# Patient Record
Sex: Male | Born: 1970 | Race: Black or African American | Hispanic: No | Marital: Single
Health system: Southern US, Community
[De-identification: ages and names within clinical notes are randomized; demographics above are authoritative.]

## PROBLEM LIST (undated history)

## (undated) DIAGNOSIS — E785 Hyperlipidemia, unspecified: Secondary | ICD-10-CM

## (undated) DIAGNOSIS — I10 Essential (primary) hypertension: Secondary | ICD-10-CM

---

## 2019-05-03 ENCOUNTER — Encounter (HOSPITAL_COMMUNITY): Payer: Self-pay | Admitting: Emergency Medicine

## 2019-05-03 ENCOUNTER — Emergency Department (HOSPITAL_COMMUNITY): Payer: Self-pay

## 2019-05-03 ENCOUNTER — Emergency Department (HOSPITAL_COMMUNITY)
Admission: EM | Admit: 2019-05-03 | Discharge: 2019-05-03 | Disposition: A | Discharge status: Home / Self Care | Payer: Self-pay | Attending: Emergency Medicine | Admitting: Emergency Medicine

## 2019-05-03 ENCOUNTER — Other Ambulatory Visit: Payer: Self-pay

## 2019-05-03 DIAGNOSIS — I10 Essential (primary) hypertension: Secondary | ICD-10-CM | POA: Insufficient documentation

## 2019-05-03 DIAGNOSIS — E876 Hypokalemia: Secondary | ICD-10-CM | POA: Insufficient documentation

## 2019-05-03 DIAGNOSIS — R911 Solitary pulmonary nodule: Secondary | ICD-10-CM | POA: Insufficient documentation

## 2019-05-03 DIAGNOSIS — R0789 Other chest pain: Secondary | ICD-10-CM | POA: Insufficient documentation

## 2019-05-03 DIAGNOSIS — Z79899 Other long term (current) drug therapy: Secondary | ICD-10-CM | POA: Insufficient documentation

## 2019-05-03 DIAGNOSIS — M79606 Pain in leg, unspecified: Secondary | ICD-10-CM | POA: Insufficient documentation

## 2019-05-03 DIAGNOSIS — R410 Disorientation, unspecified: Secondary | ICD-10-CM | POA: Insufficient documentation

## 2019-05-03 DIAGNOSIS — R519 Headache, unspecified: Secondary | ICD-10-CM | POA: Insufficient documentation

## 2019-05-03 DIAGNOSIS — R109 Unspecified abdominal pain: Secondary | ICD-10-CM

## 2019-05-03 HISTORY — DX: Essential (primary) hypertension: I10

## 2019-05-03 HISTORY — DX: Hyperlipidemia, unspecified: E78.5

## 2019-05-03 LAB — MAGNESIUM: Magnesium: 2.2 mg/dL (ref 1.7–2.4)

## 2019-05-03 LAB — COMPREHENSIVE METABOLIC PANEL
ALT: 23 U/L (ref 0–44)
AST: 36 U/L (ref 15–41)
Albumin: 3.7 g/dL (ref 3.5–5.0)
Alkaline Phosphatase: 38 U/L (ref 38–126)
Anion gap: 11 (ref 5–15)
BUN: 10 mg/dL (ref 6–20)
CO2: 25 mmol/L (ref 22–32)
Calcium: 9 mg/dL (ref 8.9–10.3)
Chloride: 101 mmol/L (ref 98–111)
Creatinine, Ser: 1.11 mg/dL (ref 0.61–1.24)
GFR calc Af Amer: >60 mL/min (ref >60)
GFR calc non Af Amer: >60 mL/min (ref >60)
Glucose, Bld: 109 mg/dL — ABNORMAL HIGH (ref 70–99)
Potassium: 2.7 mmol/L — CL (ref 3.5–5.1)
Sodium: 137 mmol/L (ref 135–145)
Total Bilirubin: 1.5 mg/dL — ABNORMAL HIGH (ref 0.3–1.2)
Total Protein: 7.2 g/dL (ref 6.5–8.1)

## 2019-05-03 LAB — I-STAT CHEM 8, ED
BUN: 11 mg/dL (ref 6–20)
Calcium, Ion: 1.07 mmol/L — ABNORMAL LOW (ref 1.15–1.40)
Chloride: 100 mmol/L (ref 98–111)
Creatinine, Ser: 1 mg/dL (ref 0.61–1.24)
Glucose, Bld: 106 mg/dL — ABNORMAL HIGH (ref 70–99)
HCT: 43 % (ref 39.0–52.0)
Hemoglobin: 14.6 g/dL (ref 13.0–17.0)
Potassium: 2.8 mmol/L — ABNORMAL LOW (ref 3.5–5.1)
Sodium: 139 mmol/L (ref 135–145)
TCO2: 30 mmol/L (ref 22–32)

## 2019-05-03 LAB — CBC WITH DIFFERENTIAL/PLATELET
Abs Immature Granulocytes: 0.02 10*3/uL (ref 0.00–0.07)
Basophils Absolute: 0 10*3/uL (ref 0.0–0.1)
Basophils Relative: 0 %
Eosinophils Absolute: 0.1 10*3/uL (ref 0.0–0.5)
Eosinophils Relative: 1 %
HCT: 40.4 % (ref 39.0–52.0)
Hemoglobin: 14 g/dL (ref 13.0–17.0)
Immature Granulocytes: 0 %
Lymphocytes Relative: 18 %
Lymphs Abs: 1.4 10*3/uL (ref 0.7–4.0)
MCH: 27.9 pg (ref 26.0–34.0)
MCHC: 34.7 g/dL (ref 30.0–36.0)
MCV: 80.5 fL (ref 80.0–100.0)
Monocytes Absolute: 0.8 10*3/uL (ref 0.1–1.0)
Monocytes Relative: 9 %
Neutro Abs: 5.8 10*3/uL (ref 1.7–7.7)
Neutrophils Relative %: 72 %
Platelets: 251 10*3/uL (ref 150–400)
RBC: 5.02 MIL/uL (ref 4.22–5.81)
RDW: 13.6 % (ref 11.5–15.5)
WBC: 8 10*3/uL (ref 4.0–10.5)
nRBC: 0 % (ref 0.0–0.2)

## 2019-05-03 LAB — TROPONIN I (HIGH SENSITIVITY)
Troponin I (High Sensitivity): 4 ng/L (ref <18)
Troponin I (High Sensitivity): 2 ng/L (ref <18)

## 2019-05-03 LAB — URINALYSIS, ROUTINE W REFLEX MICROSCOPIC
Bilirubin Urine: NEGATIVE
Glucose, UA: NEGATIVE mg/dL
Hgb urine dipstick: NEGATIVE
Ketones, ur: NEGATIVE mg/dL
Leukocytes,Ua: NEGATIVE
Nitrite: NEGATIVE
Protein, ur: NEGATIVE mg/dL
Specific Gravity, Urine: 1.016 (ref 1.005–1.030)
pH: 7 (ref 5.0–8.0)

## 2019-05-03 LAB — RAPID URINE DRUG SCREEN, HOSP PERFORMED
Amphetamines: NOT DETECTED
Barbiturates: NOT DETECTED
Benzodiazepines: NOT DETECTED
Cocaine: NOT DETECTED
Opiates: NOT DETECTED
Tetrahydrocannabinol: NOT DETECTED

## 2019-05-03 LAB — ETHANOL: Alcohol, Ethyl (B): <10 mg/dL (ref <10)

## 2019-05-03 MED ORDER — POTASSIUM CHLORIDE CRYS ER 20 MEQ PO TBCR
40.0000 meq | EXTENDED_RELEASE_TABLET | Freq: Once | ORAL | Status: AC
  Administered 2019-05-03: 40 meq via ORAL
  Filled 2019-05-03: qty 2

## 2019-05-03 MED ORDER — IOHEXOL 350 MG/ML SOLN
100.0000 mL | Freq: Once | INTRAVENOUS | Status: AC | PRN
  Administered 2019-05-03: 100 mL via INTRAVENOUS

## 2019-05-03 MED ORDER — LACTATED RINGERS IV BOLUS
1000.0000 mL | Freq: Once | INTRAVENOUS | Status: AC
  Administered 2019-05-03: 1000 mL via INTRAVENOUS

## 2019-05-03 MED ORDER — POTASSIUM CHLORIDE ER 20 MEQ PO TBCR: 20.0000 meq | EXTENDED_RELEASE_TABLET | Freq: Every day | ORAL | 0 refills | Status: DC

## 2019-05-03 MED ORDER — ACETAMINOPHEN 325 MG PO TABS
650.0000 mg | ORAL_TABLET | Freq: Once | ORAL | Status: AC
  Administered 2019-05-03: 650 mg via ORAL
  Filled 2019-05-03: qty 2

## 2019-05-03 MED ORDER — POTASSIUM CHLORIDE ER 20 MEQ PO TBCR: 20.0000 meq | EXTENDED_RELEASE_TABLET | Freq: Every day | ORAL | 0 refills | Status: AC

## 2019-05-03 NOTE — ED Notes (Signed)
Lab notified of add on magnesium.

## 2019-05-03 NOTE — ED Triage Notes (Signed)
Per EMS: pt traveling and was picked up at a hotel with c/o right sided groin pain, left sided flank pain, headache,  and hypertension. Pt was negative for stroke screening per EMS.  Pt presenting with "odd Behavior" and slow to respond. Pt self administered 2 - 81 mg ASA.    EMS vitals: BP 182/110 CBG 112

## 2019-05-03 NOTE — Discharge Instructions (Signed)
You had labs and a CT scan performed in the Emergency Department today. Your potassium was low, which may contribute to your cramping. You may take Tylenol or ibuprofen, available over-the-counter according to label instructions as needed for pain. Please follow-up with your family doctor in the next week for recheck of your labs. Your CT scan showed a pulmonary nodule on your right lung. This will need to follow-up in 12 months for repeat imaging.

## 2019-05-03 NOTE — ED Notes (Signed)
Did ekg shown to Dr Madilyn Hook

## 2019-05-03 NOTE — ED Provider Notes (Signed)
[facility] EMERGENCY DEPARTMENT Provider Note   CSN: [id number] Arrival date & time: 05/03/19  4008     History Chief Complaint  Patient presents with  . Groin Pain  . Leg Pain  . Flank Pain    Travis Jarvis is a 49 y.o. male.  The history is provided by the patient and the EMS personnel. No language interpreter was used.  Groin Pain  Leg Pain Flank Pain   Travis Jarvis is a 49 y.o. male who presents to the Emergency Department complaining of flank pain, headache. Level V caveat due to confusion. History is provided by the patient and EMS. Per EMS patient is traveling and was picked up in a hotel with complaints of right sided groin pain, flank pain, headache, chest pain. He states that he was feeling well until 1030 last night when he developed chest pain followed by right flank, groin and leg pain. He has associated headache. He states that he feels somewhat confused. He denies any nausea, vomiting. He states that he did have difficulty swallowing when this first began. He has a history of hypertension. He denies any tobacco use. He drinks occasional alcohol, none recently. Denies any drug use. Symptoms are severe in nature. Overall his pain is improving.    Past Medical History:  Diagnosis Date  . Hyperlipemia   . Hypertension     There are no problems to display for this patient.   * The histories are not reviewed yet. Please review them in the "History" navigator section and refresh this [place].     No family history on file.  Social History   Tobacco Use  . Smoking status: Not on file  Substance Use Topics  . Alcohol use: Yes  . Drug use: Not Currently    Home Medications Prior to Admission medications   Medication Sig Start Date End Date Taking? Authorizing Provider  amLODipine (NORVASC) 10 MG tablet Take 10 mg by mouth daily.   Yes [provider]  aspirin EC 81 MG tablet Take 81 mg by mouth as needed for mild  pain.   Yes [provider]  Potassium Chloride ER 20 MEQ TBCR Take 20 mEq by mouth daily. 05/03/19   Quintella Reichert, MD    Allergies    Penicillins  Review of Systems   Review of Systems  Genitourinary: Positive for flank pain.  All other systems reviewed and are negative.   Physical Exam Updated Vital Signs BP 139/90   Pulse 98   Temp 98.9 F (37.2 C)   Resp (!) 22   SpO2 95%   Physical Exam Vitals and nursing note reviewed.  Constitutional:      Appearance: He is well-developed.  HENT:     Head: Normocephalic and atraumatic.  Cardiovascular:     Rate and Rhythm: Normal rate and regular rhythm.     Heart sounds: No murmur.  Pulmonary:     Effort: Pulmonary effort is normal. No respiratory distress.     Breath sounds: Normal breath sounds.  Abdominal:     Palpations: Abdomen is soft.     Tenderness: There is no guarding or rebound.     Comments: Mild generalized abdominal tenderness  Genitourinary:    Penis: Normal.      Testes: Normal.  Musculoskeletal:        General: No tenderness.     Comments: 2+ DP pulses bilaterally, 2+ radial pulses bilaterally.  Skin:    General: Skin is warm and  dry.  Neurological:     Mental Status: He is alert.     Comments: Confused and slow to answer questions. Frequently rolls eyes back to the right but is able to redirect. EOM I. 4/5 strength in all four extremities with sensation to light touch intact in all four extremities. Oriented to place and time. Disoriented to recent events.  Psychiatric:     Comments: Poor eye contact     ED Results / Procedures / Treatments   Labs (all labs ordered are listed, but only abnormal results are displayed) Labs Reviewed  COMPREHENSIVE METABOLIC PANEL - Abnormal; Notable for the following components:      Result Value   Potassium 2.7 (*)    Glucose, Bld 109 (*)    Total Bilirubin 1.5 (*)    All other components within normal limits  URINALYSIS, ROUTINE W REFLEX MICROSCOPIC -  Abnormal; Notable for the following components:   Color, Urine STRAW (*)    All other components within normal limits  I-STAT CHEM 8, ED - Abnormal; Notable for the following components:   Potassium 2.8 (*)    Glucose, Bld 106 (*)    Calcium, Ion 1.07 (*)    All other components within normal limits  ETHANOL  CBC WITH DIFFERENTIAL/PLATELET  RAPID URINE DRUG SCREEN, HOSP PERFORMED  MAGNESIUM  TROPONIN I (HIGH SENSITIVITY)  TROPONIN I (HIGH SENSITIVITY)    EKG EKG Interpretation  Date/Time:  Saturday May 03 2019 07:47:58 EST Ventricular Rate:  96 PR Interval:    QRS Duration: 94 QT Interval:  340 QTC Calculation: 430 R Axis:   67 Text Interpretation: Sinus rhythm baseline wander in V2, V3 Nonspecific ST abnormality Confirmed by Tilden Fossa [phone]) on 05/03/2019 8:03:53 AM   Radiology CT Head Wo Contrast  Result Date: 05/03/2019 CLINICAL DATA:  49 year old male with altered mental status. EXAM: CT HEAD WITHOUT CONTRAST TECHNIQUE: Contiguous axial images were obtained from the base of the skull through the vertex without intravenous contrast. COMPARISON:  None. FINDINGS: Brain: No evidence of acute infarction, hemorrhage, hydrocephalus, extra-axial collection or mass lesion/mass effect. Vascular: No hyperdense vessel or unexpected calcification. Skull: Normal. Negative for fracture or focal lesion. Sinuses/Orbits: No acute finding. Other: None. IMPRESSION: Unremarkable noncontrast head CT. Electronically Signed   By: Harmon Pier M.D.   On: 05/03/2019 08:25   DG Chest Port 1 View  Result Date: 05/03/2019 CLINICAL DATA:  Acute chest pain. EXAM: PORTABLE CHEST 1 VIEW COMPARISON:  None. FINDINGS: UPPER limits normal heart size noted. There is no evidence of focal airspace disease, pulmonary edema, suspicious pulmonary nodule/mass, pleural effusion, or pneumothorax. No acute bony abnormalities are identified. IMPRESSION: No active disease. Electronically Signed   By: Harmon Pier M.D.    On: 05/03/2019 08:19   CT Angio Chest/Abd/Pel for Dissection W and/or W/WO  Result Date: 05/03/2019 CLINICAL DATA:  Hypertension, left flank pain. EXAM: CT ANGIOGRAPHY CHEST, ABDOMEN AND PELVIS TECHNIQUE: Multidetector CT imaging through the chest, abdomen and pelvis was performed using the standard protocol during bolus administration of intravenous contrast. Multiplanar reconstructed images and MIPs were obtained and reviewed to evaluate the vascular anatomy. CONTRAST:  OMNIPAQUE IOHEXOL 350 MG/ML SOLN COMPARISON:  None. FINDINGS: CTA CHEST FINDINGS Cardiovascular: Preferential opacification of the thoracic aorta. No evidence of thoracic aortic aneurysm or dissection. Normal heart size. No pericardial effusion. Mediastinum/Nodes: No enlarged mediastinal, hilar, or axillary lymph nodes. Thyroid gland, trachea, and esophagus demonstrate no significant findings. Lungs/Pleura: No pneumothorax or pleural effusion is noted. 3  mm right apical nodule is noted best seen on image number 43 of series 6. No consolidative process is noted. Musculoskeletal: No chest wall abnormality. No acute or significant osseous findings. Review of the MIP images confirms the above findings. CTA ABDOMEN AND PELVIS FINDINGS VASCULAR Aorta: Normal caliber aorta without aneurysm, dissection, vasculitis or significant stenosis. Celiac: Patent without evidence of aneurysm, dissection, vasculitis or significant stenosis. SMA: Patent without evidence of aneurysm, dissection, vasculitis or significant stenosis. Renals: Both renal arteries are patent without evidence of aneurysm, dissection, vasculitis, fibromuscular dysplasia or significant stenosis. IMA: Patent without evidence of aneurysm, dissection, vasculitis or significant stenosis. Inflow: Patent without evidence of aneurysm, dissection, vasculitis or significant stenosis. Veins: No obvious venous abnormality within the limitations of this arterial phase study. Review of the MIP  images confirms the above findings. NON-VASCULAR Hepatobiliary: No focal liver abnormality is seen. No gallstones, gallbladder wall thickening, or biliary dilatation. Pancreas: Unremarkable. No pancreatic ductal dilatation or surrounding inflammatory changes. Spleen: Normal in size without focal abnormality. Adrenals/Urinary Tract: Adrenal glands are unremarkable. Kidneys are normal, without renal calculi, focal lesion, or hydronephrosis. Bladder is unremarkable. Stomach/Bowel: Stomach is within normal limits. Appendix appears normal. No evidence of bowel wall thickening, distention, or inflammatory changes. Lymphatic: No significant adenopathy is noted. Reproductive: Prostate is unremarkable. Other: No abdominal wall hernia or abnormality. No abdominopelvic ascites. Musculoskeletal: No acute or significant osseous findings. Review of the MIP images confirms the above findings. IMPRESSION: 1. No evidence of thoracic or abdominal aortic dissection or aneurysm. 2. 3 mm right apical nodule is noted. No follow-up needed if patient is low-risk. Non-contrast chest CT can be considered in 12 months if patient is high-risk. This recommendation follows the consensus statement: Guidelines for Management of Incidental Pulmonary Nodules Detected on CT Images: From the Fleischner Society 2017; Radiology 2017; 284:228-243. 3. No other abnormality seen in the chest, abdomen or pelvis. Electronically Signed   By: Lupita Raider M.D.   On: 05/03/2019 08:56    Procedures Procedures (including critical care time)  Medications Ordered in ED Medications  iohexol (OMNIPAQUE) 350 MG/ML injection 100 mL (100 mLs Intravenous Contrast Given 05/03/19 0804)  potassium chloride SA (KLOR-CON) CR tablet 40 mEq (40 mEq Oral Given 05/03/19 0854)  lactated ringers bolus 1,000 mL (0 mLs Intravenous Stopped 05/03/19 1035)  acetaminophen (TYLENOL) tablet 650 mg (650 mg Oral Given 05/03/19 1230)    ED Course  I have reviewed the triage vital  signs and the nursing notes.  Pertinent labs & imaging results that were available during my care of the patient were reviewed by me and considered in my medical decision making (see chart for details).    MDM Rules/Calculators/A&P                     Patient here for evaluation of chest pain, flank pain, leg pain and headache. Patient mildly confused and drowsy on initial evaluation. Imaging obtained to rule out dissection. CTA is negative for dissection, CT head is negative for acute disease process. CT does demonstrate incidental finding of pulmonary nodule, discussed this with the patient. Labs are significant for hypokalemia, no additional significant abnormalities. On repeat assessment patient appears improved. He was treated with potassium replacement. Patient does state that he is been very tired lately and is currently traveling to attend a funeral in [place]. Presentation is not consistent with CVA, meningitis, serious bacterial infection. Discussed with patient home care for hypokalemia and importance of outpatient follow-up. Results of  studies also discussed with the patient sister at his request over the phone. Final Clinical Impression(s) / ED Diagnoses Final diagnoses:  Hypokalemia  Pulmonary nodule  Flank pain    Rx / DC Orders ED Discharge Orders         Ordered    potassium chloride 20 MEQ TBCR  Daily,   Status:  Discontinued     05/03/19 1216    Potassium Chloride ER 20 MEQ TBCR  Daily     05/03/19 1227           Tilden Fossa, MD 05/03/19 1250

## 2019-05-03 NOTE — ED Notes (Signed)
Pt transported to CT ?

## 2019-05-03 NOTE — ED Notes (Signed)
Pt drank full cup of water without issue.  

## 2021-01-12 IMAGING — CT CT ANGIO CHEST-ABD-PELV FOR DISSECTION W/ AND WO/W CM
2 of 7 series · 11 of 36 positions shown, 15 images · IV contrast (APPLIED)
Comparison: None.

CLINICAL DATA: Hypertension, left flank pain.

EXAM:
CT ANGIOGRAPHY CHEST, ABDOMEN AND PELVIS
TECHNIQUE: Multidetector CT imaging through the chest, abdomen and pelvis was
performed using the standard protocol during bolus administration of
intravenous contrast. Multiplanar reconstructed images and MIPs were
obtained and reviewed to evaluate the vascular anatomy.
CONTRAST:  100mL OMNIPAQUE IOHEXOL 350 MG/ML SOLN

[Series 5: arterial · axial · arterial · 0.73mm/px · z∈[-943,-303]mm · 10 of 366 slices shown, 13 images]
[im 23/366  mediastinal]
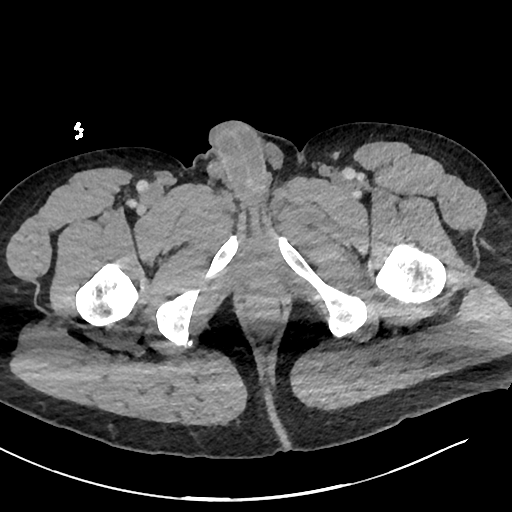
[im 23/366  bone]
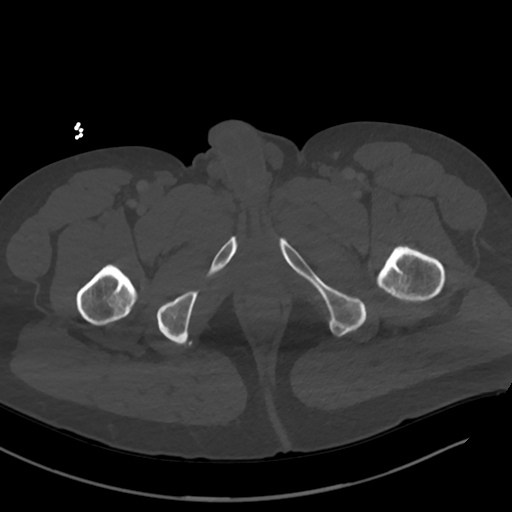
[im 69/366  mediastinal]
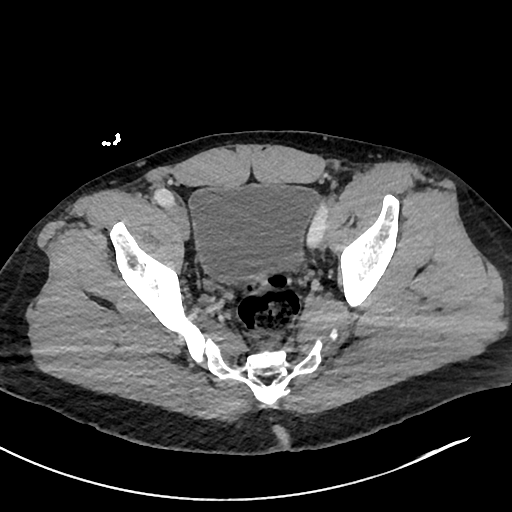
[im 115/366  mediastinal]
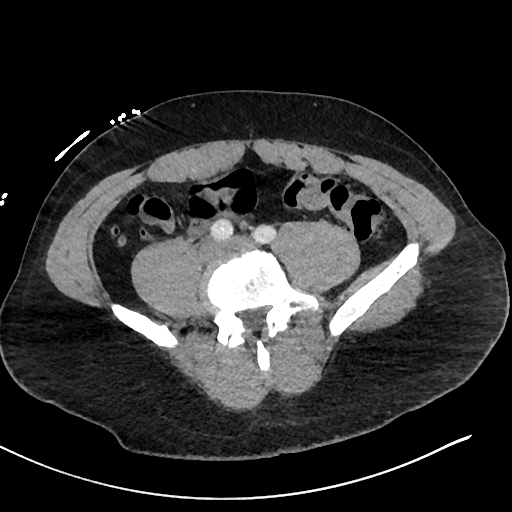
[im 160/366  mediastinal]
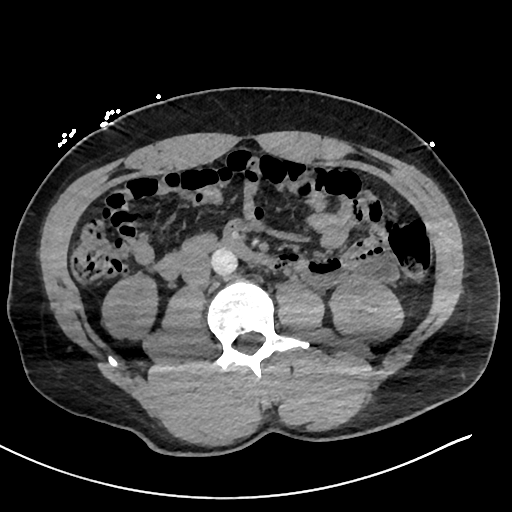
[im 206/366  mediastinal]
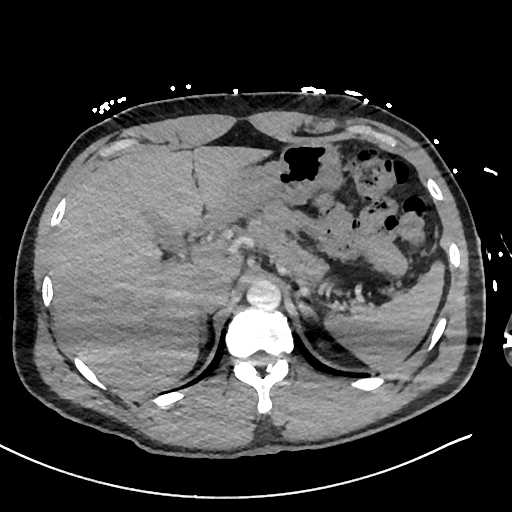
[im 251/366  mediastinal]
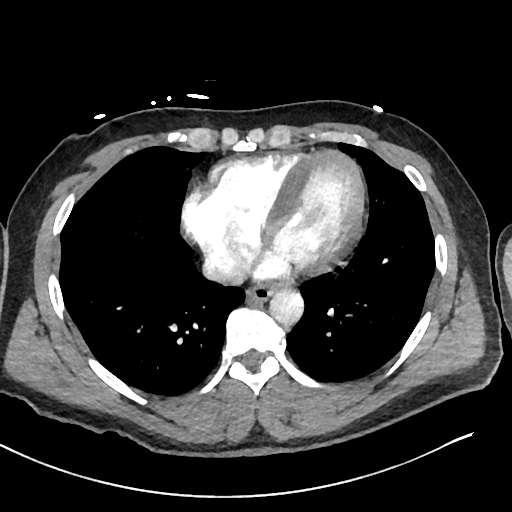
[im 274/366  lung]
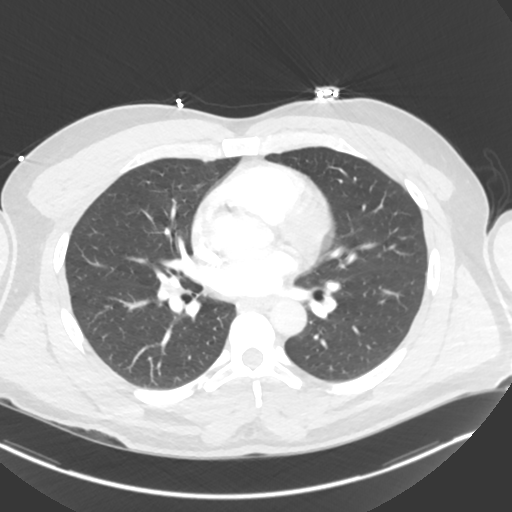
[im 297/366  mediastinal]
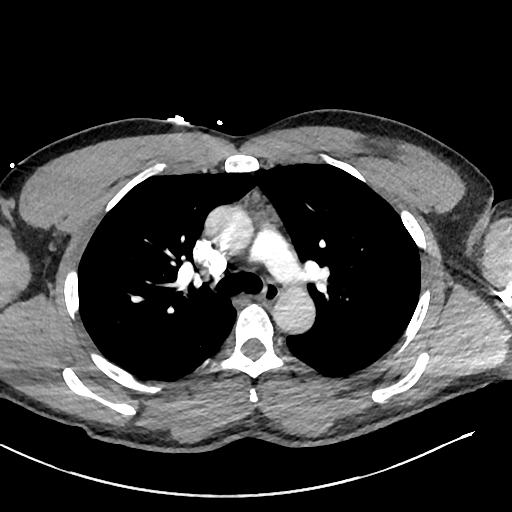
[im 297/366  lung]
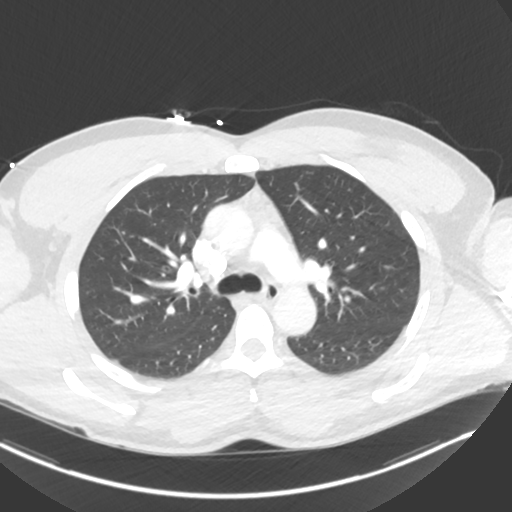
[im 320/366  lung]
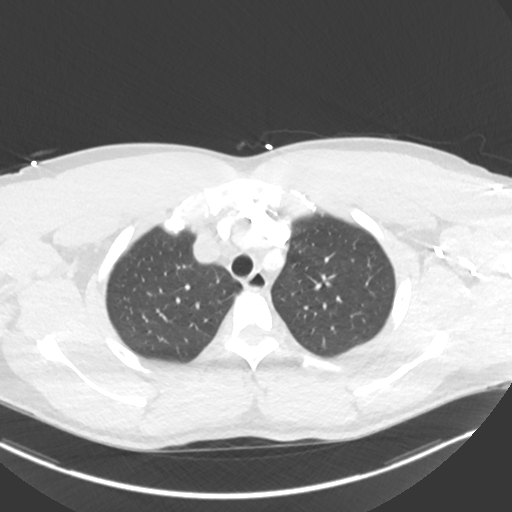
[im 343/366  mediastinal]
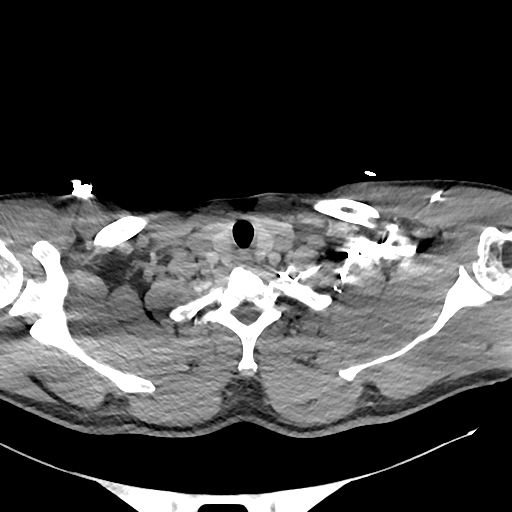
[im 343/366  lung]
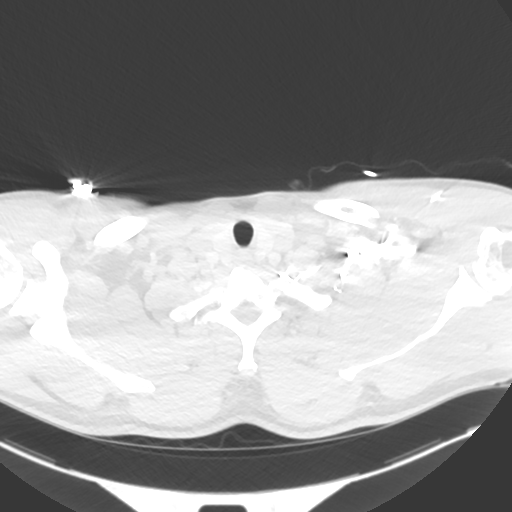

[Series 8: cor · coronal · 0.70mm/px · 1 of 151 slices shown, 2 images]
[im 76/151  mediastinal]
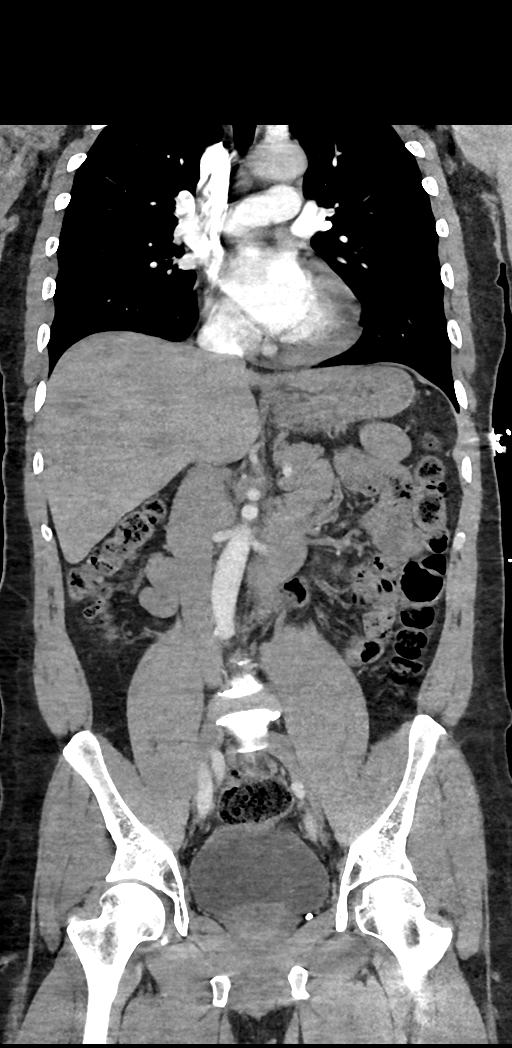
[im 76/151  bone]
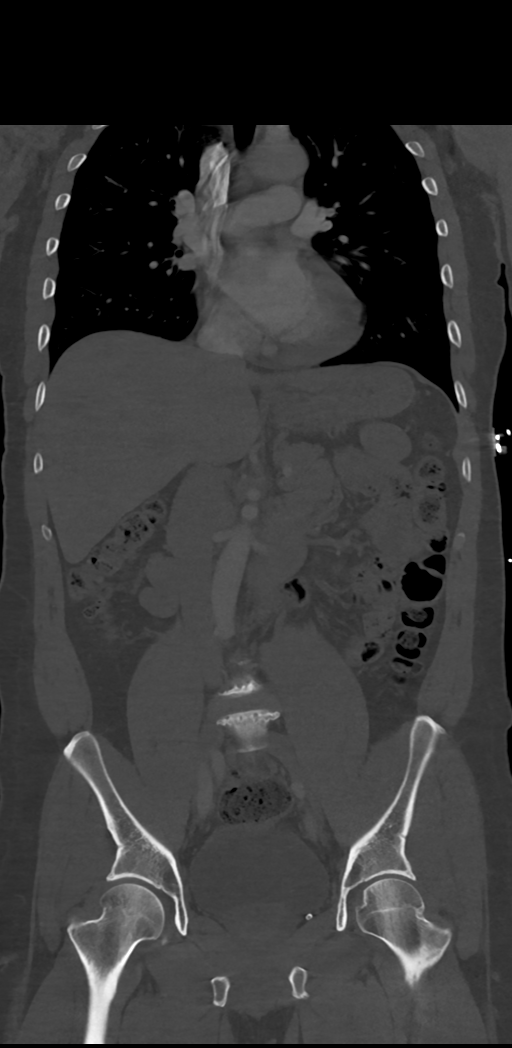

[11 of 36 positions shown; findings below may reference images not displayed]

FINDINGS: CTA CHEST FINDINGS

Cardiovascular: Preferential opacification of the thoracic aorta. No
evidence of thoracic aortic aneurysm or dissection. Normal heart
size. No pericardial effusion.

Mediastinum/Nodes: No enlarged mediastinal, hilar, or axillary lymph
nodes. Thyroid gland, trachea, and esophagus demonstrate no
significant findings.

Lungs/Pleura: No pneumothorax or pleural effusion is noted. 3 mm
right apical nodule is noted best seen on image number 43 of series
6. No consolidative process is noted.

Musculoskeletal: No chest wall abnormality. No acute or significant
osseous findings.

Review of the MIP images confirms the above findings.

CTA ABDOMEN AND PELVIS FINDINGS

VASCULAR

Aorta: Normal caliber aorta without aneurysm, dissection, vasculitis
or significant stenosis.

Celiac: Patent without evidence of aneurysm, dissection, vasculitis
or significant stenosis.

SMA: Patent without evidence of aneurysm, dissection, vasculitis or
significant stenosis.

Renals: Both renal arteries are patent without evidence of aneurysm,
dissection, vasculitis, fibromuscular dysplasia or significant
stenosis.

IMA: Patent without evidence of aneurysm, dissection, vasculitis or
significant stenosis.

Inflow: Patent without evidence of aneurysm, dissection, vasculitis
or significant stenosis.

Veins: No obvious venous abnormality within the limitations of this
arterial phase study.

Review of the MIP images confirms the above findings.

NON-VASCULAR

Hepatobiliary: No focal liver abnormality is seen. No gallstones,
gallbladder wall thickening, or biliary dilatation.

Pancreas: Unremarkable. No pancreatic ductal dilatation or
surrounding inflammatory changes.

Spleen: Normal in size without focal abnormality.

Adrenals/Urinary Tract: Adrenal glands are unremarkable. Kidneys are
normal, without renal calculi, focal lesion, or hydronephrosis.
Bladder is unremarkable.

Stomach/Bowel: Stomach is within normal limits. Appendix appears
normal. No evidence of bowel wall thickening, distention, or
inflammatory changes.

Lymphatic: No significant adenopathy is noted.

Reproductive: Prostate is unremarkable.

Other: No abdominal wall hernia or abnormality. No abdominopelvic
ascites.

Musculoskeletal: No acute or significant osseous findings.

Review of the MIP images confirms the above findings.
IMPRESSION: 1. No evidence of thoracic or abdominal aortic dissection or
aneurysm.
2. 3 mm right apical nodule is noted. No follow-up needed if patient
is low-risk. Non-contrast chest CT can be considered in 12 months if
patient is high-risk. This recommendation follows the consensus
statement: Guidelines for Management of Incidental Pulmonary Nodules
Detected on CT Images: From the [HOSPITAL] 5812; Radiology
3. No other abnormality seen in the chest, abdomen or pelvis.
